# Patient Record
Sex: Female | Born: 1985 | Race: White | Hispanic: No | Marital: Single | Smoking: Never smoker
Health system: Southern US, Community
[De-identification: ages and names within clinical notes are randomized; demographics above are authoritative.]

## PROBLEM LIST (undated history)

## (undated) DIAGNOSIS — J45909 Unspecified asthma, uncomplicated: Secondary | ICD-10-CM

---

## 2014-06-28 ENCOUNTER — Ambulatory Visit
Admission: RE | Admit: 2014-06-28 | Discharge: 2014-06-28 | Disposition: A | Discharge status: Home / Self Care | Payer: 59 | Source: Ambulatory Visit | Attending: Family Medicine | Admitting: Family Medicine

## 2014-06-28 ENCOUNTER — Other Ambulatory Visit: Payer: Self-pay | Admitting: Family Medicine

## 2014-06-28 DIAGNOSIS — Z0000 Encounter for general adult medical examination without abnormal findings: Secondary | ICD-10-CM

## 2014-08-09 ENCOUNTER — Telehealth: Payer: Self-pay | Admitting: Gynecology

## 2014-08-09 NOTE — Telephone Encounter (Signed)
08/09/14-I received this patient ins info from [employer]. She has the [place] [facility],The used by the college. It covers the Paraguard and insertion at 100%, no copay as it is for contraception.Pt has appt to be seen with JF on 08-28-14. Not sure if he will be able to insert at that visit.wl

## 2014-08-16 ENCOUNTER — Other Ambulatory Visit: Payer: Self-pay | Admitting: Gynecology

## 2014-08-28 ENCOUNTER — Ambulatory Visit: Payer: Self-pay | Admitting: Gynecology

## 2014-09-13 ENCOUNTER — Encounter: Payer: Self-pay | Admitting: Gynecology

## 2014-09-13 ENCOUNTER — Ambulatory Visit (INDEPENDENT_AMBULATORY_CARE_PROVIDER_SITE_OTHER): Payer: BC Managed Care – PPO | Admitting: Gynecology

## 2014-09-13 VITALS — BP 104/68 | Ht 67.5 in | Wt 118.0 lb

## 2014-09-13 DIAGNOSIS — Z30430 Encounter for insertion of intrauterine contraceptive device: Secondary | ICD-10-CM

## 2014-09-13 DIAGNOSIS — Z975 Presence of (intrauterine) contraceptive device: Secondary | ICD-10-CM | POA: Insufficient documentation

## 2014-09-13 NOTE — Patient Instructions (Signed)
Intrauterine Device Insertion Most often, an intrauterine device (IUD) is inserted into the uterus to prevent pregnancy. There are 2 types of IUDs available:  Copper IUD--This type of IUD creates an environment that is not favorable to sperm survival. The mechanism of action of the copper IUD is not known for certain. It can stay in place for 10 years.  Hormone IUD--This type of IUD contains the hormone progestin (synthetic progesterone). The progestin thickens the cervical mucus and prevents sperm from entering the uterus, and it also thins the uterine lining. There is no evidence that the hormone IUD prevents implantation. One hormone IUD can stay in place for up to 5 years, and a different hormone IUD can stay in place for up to 3 years. An IUD is the most cost-effective birth control if left in place for the full duration. It may be removed at any time. LET YOUR HEALTH CARE PROVIDER KNOW ABOUT:  Any allergies you have.  All medicines you are taking, including vitamins, herbs, eye drops, creams, and over-the-counter medicines.  Previous problems you or members of your family have had with the use of anesthetics.  Any blood disorders you have.  Previous surgeries you have had.  Possibility of pregnancy.  Medical conditions you have. RISKS AND COMPLICATIONS  Generally, intrauterine device insertion is a safe procedure. However, as with any procedure, complications can occur. Possible complications include:  Accidental puncture (perforation) of the uterus.  Accidental placement of the IUD either in the muscle layer of the uterus (myometrium) or outside the uterus. If this happens, the IUD can be found essentially floating around the bowels and must be taken out surgically.  The IUD may fall out of the uterus (expulsion). This is more common in women who have recently had a child.   Pregnancy in the fallopian tube (ectopic).  Pelvic inflammatory disease (PID), which is infection of  the uterus and fallopian tubes. The risk of PID is slightly increased in the first 20 days after the IUD is placed, but the overall risk is still very low. BEFORE THE PROCEDURE  Schedule the IUD insertion for when you will have your menstrual period or right after, to make sure you are not pregnant. Placement of the IUD is better tolerated shortly after a menstrual cycle.  You may need to take tests or be examined to make sure you are not pregnant.  You may be required to take a pregnancy test.  You may be required to get checked for sexually transmitted infections (STIs) prior to placement. Placing an IUD in someone who has an infection can make the infection worse.  You may be given a pain reliever to take 1 or 2 hours before the procedure.  An exam will be performed to determine the size and position of your uterus.  Ask your health care provider about changing or stopping your regular medicines. PROCEDURE   A tool (speculum) is placed in the vagina. This allows your health care provider to see the lower part of the uterus (cervix).  The cervix is prepped with a medicine that lowers the risk of infection.  You may be given a medicine to numb each side of the cervix (intracervical or paracervical block). This is used to block and control any discomfort with insertion.  A tool (uterine sound) is inserted into the uterus to determine the length of the uterine cavity and the direction the uterus may be tilted.  A slim instrument (IUD inserter) is inserted through the cervical   canal and into your uterus.  The IUD is placed in the uterine cavity and the insertion device is removed.  The nylon string that is attached to the IUD and used for eventual IUD removal is trimmed. It is trimmed so that it lays high in the vagina, just outside the cervix. AFTER THE PROCEDURE  You may have bleeding after the procedure. This is normal. It varies from light spotting for a few days to menstrual-like  bleeding.  You may have mild cramping. Document Released: 07/28/2011 Document Revised: 09/19/2013 Document Reviewed: 05/20/2013 ExitCare Patient Information 2015 [employer]. This information is not intended to replace advice given to you by your health care provider. Make sure you discuss any questions you have with your health care provider.  

## 2014-09-13 NOTE — Progress Notes (Signed)
   Patient is a 28 year old who was referred to our practice [facility] for placement of a ParaGard T380A IUD. Patient had been using condoms for contraception and wanted a nonhormonal contraceptive device. The risks benefits and pros and cons were discussed. Literature information was provided. The patient had a [facility]necological exam [facility] the [facility] 1[facility]2 months ago and was normal with normal Pap smear. Patient reports normal menstrual cycles lasting 5-7 days. Patient not interested in a flu vaccine. Patient on tail end  of her menses  Exam: Bartholin urethra Skene was within normal limits Vagina: No lesions or discharge Uterus anteverted normal size shape and consistency Adnexa: No palpable mass or tenderness Rectal exam not done                                                                    IUD procedure note       Patient presented to the office today for placement of ParaGard T3 80 IUD. The patient had previously been provided with literature information on this method of contraception. The risks benefits and pros and cons were discussed and all her questions were answered. She is fully aware that this form of contraception is 99% effective and is good for 10 years.  Pelvic exam: Bartholin urethra Skene glands: Within normal limits Vagina: No lesions or discharge Cervix: No lesions or discharge Uterus: Anteverted position Adnexa: No masses or tenderness Rectal exam: Not done  The cervix was cleansed with Betadine solution. A single-tooth tenaculum was placed on the anterior cervical lip. The uterus sounded to 7 centimeter. The IUD was shown to the patient and inserted in a sterile fashion. The IUD string was trimmed. The single-tooth tenaculum was removed. Patient was instructed to return back to the office in one month for follow up.    Lot #[id number]

## 2014-09-17 ENCOUNTER — Encounter: Payer: Self-pay | Admitting: Gynecology

## 2014-10-14 ENCOUNTER — Encounter: Payer: Self-pay | Admitting: Gynecology

## 2014-10-14 ENCOUNTER — Ambulatory Visit (INDEPENDENT_AMBULATORY_CARE_PROVIDER_SITE_OTHER): Payer: BC Managed Care – PPO | Admitting: Gynecology

## 2014-10-14 VITALS — BP 104/68

## 2014-10-14 DIAGNOSIS — Z30431 Encounter for routine checking of intrauterine contraceptive device: Secondary | ICD-10-CM

## 2014-10-14 MED ORDER — FLUCONAZOLE 150 MG PO TABS: ORAL_TABLET | ORAL | Status: DC

## 2014-10-14 MED ORDER — ERYTHROMYCIN BASE 500 MG PO TABS: 500.0000 mg | ORAL_TABLET | Freq: Four times a day (QID) | ORAL | Status: DC

## 2014-10-14 NOTE — Progress Notes (Signed)
   Patient is a 28 year old who presented to the office today for 1 month follow-up after having had the ParaGard T380A IUD placed in 09/13/2014. Patient is without any complaints today.  Exam: Bartholin urethra Skene was within normal limits Vagina: No lesions or discharge Cervix: IUD string visualized Exam: Uterus anteverted normal size shape and consistency Adnexa: No palpable masses or tenderness Rectal exam: Not done  Patient refuses flu vaccine  Patient going to be backpacking required tomorrow in the month of January. Come going to call in a prescription for Diflucan for her to have available in case she may needed as well as one week's worth of erythromycin to have available when necessary (patient allergic to penicillin). Patient scheduled return next year for annual exam or when necessary.

## 2014-10-14 NOTE — Patient Instructions (Signed)

## 2015-06-08 IMAGING — CR DG CHEST 2V
2 series · 2 of 2 positions shown · non-contrast
Comparison: None.

CLINICAL DATA: Chest pain, cough

EXAM:
CHEST  2 VIEW

[w chest pa]
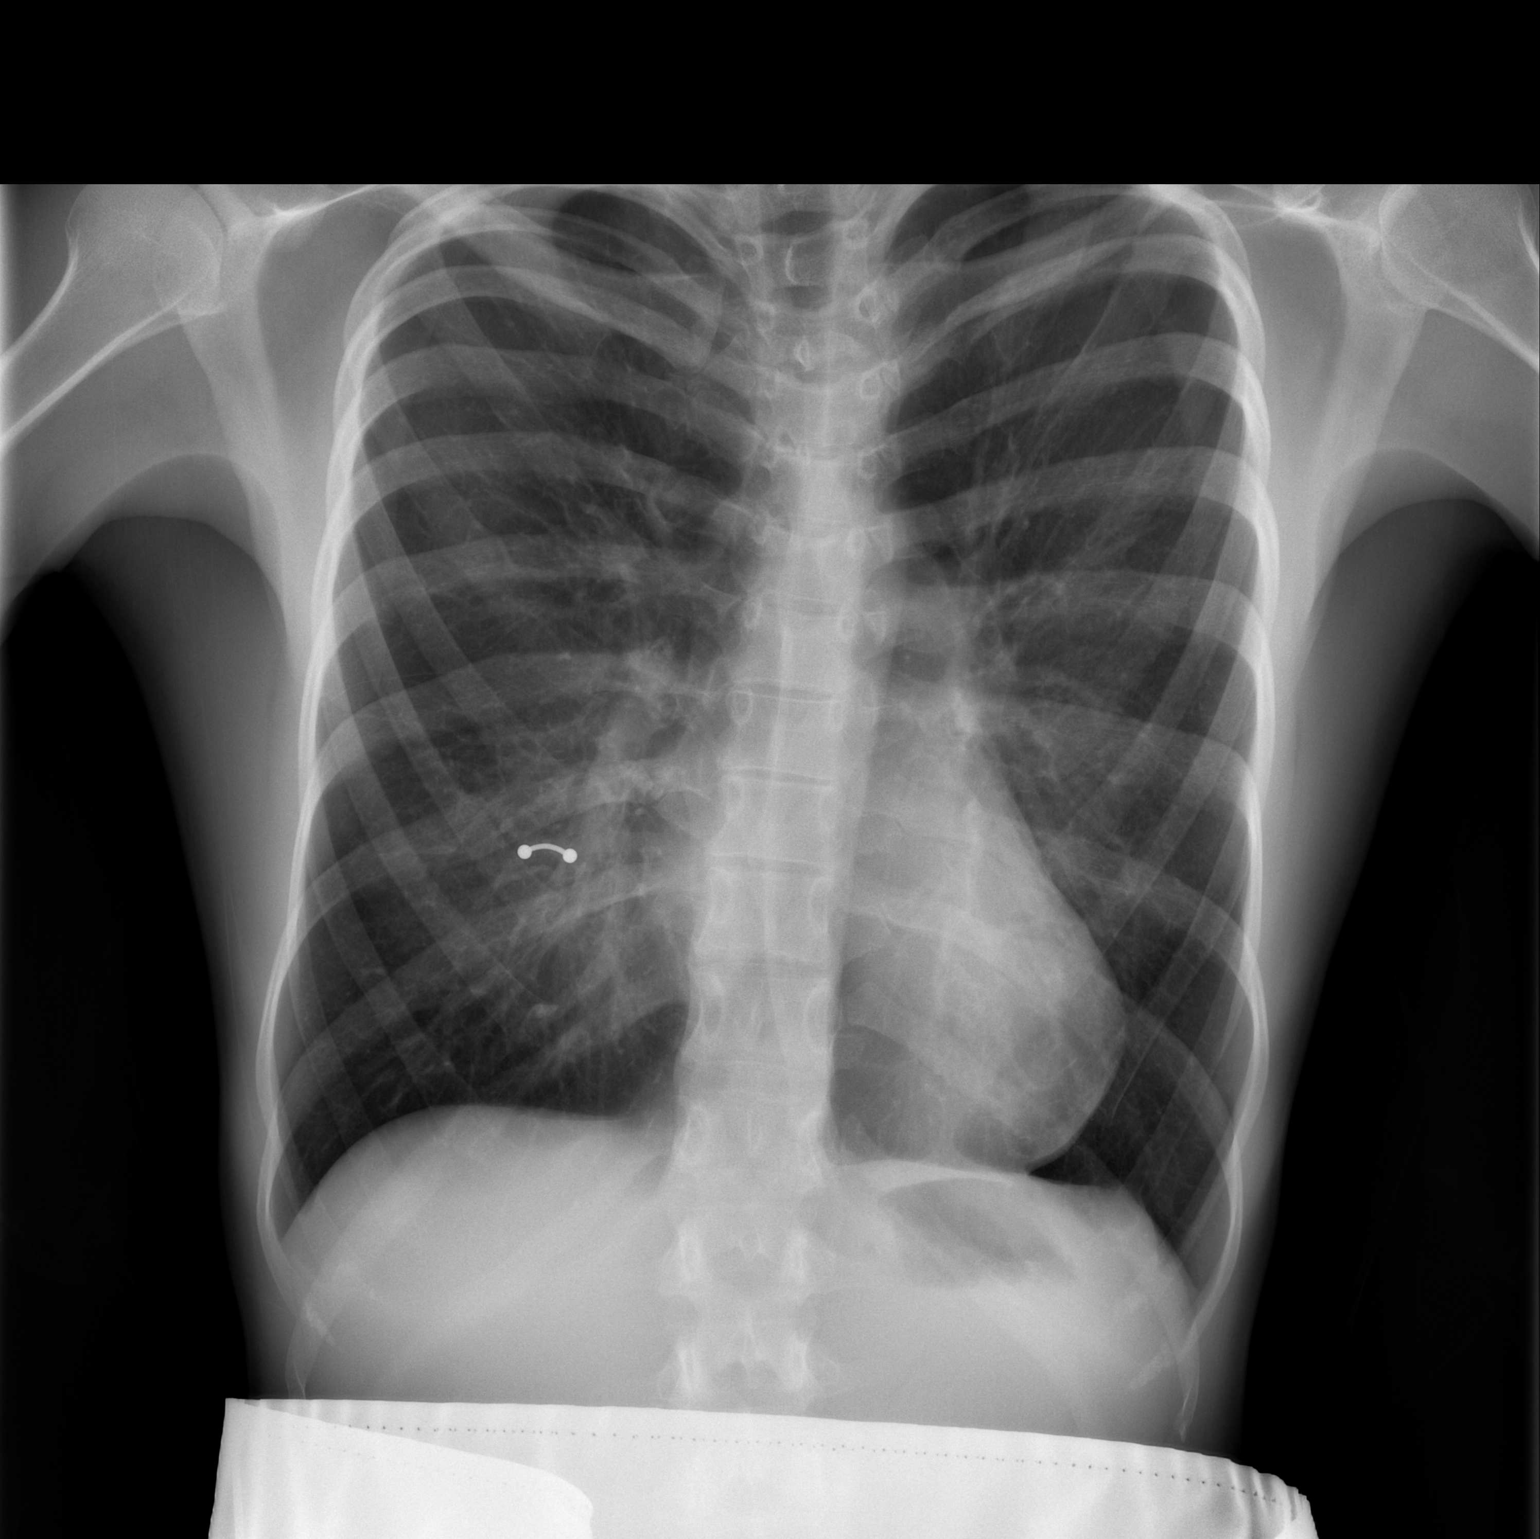

[w chest lat]
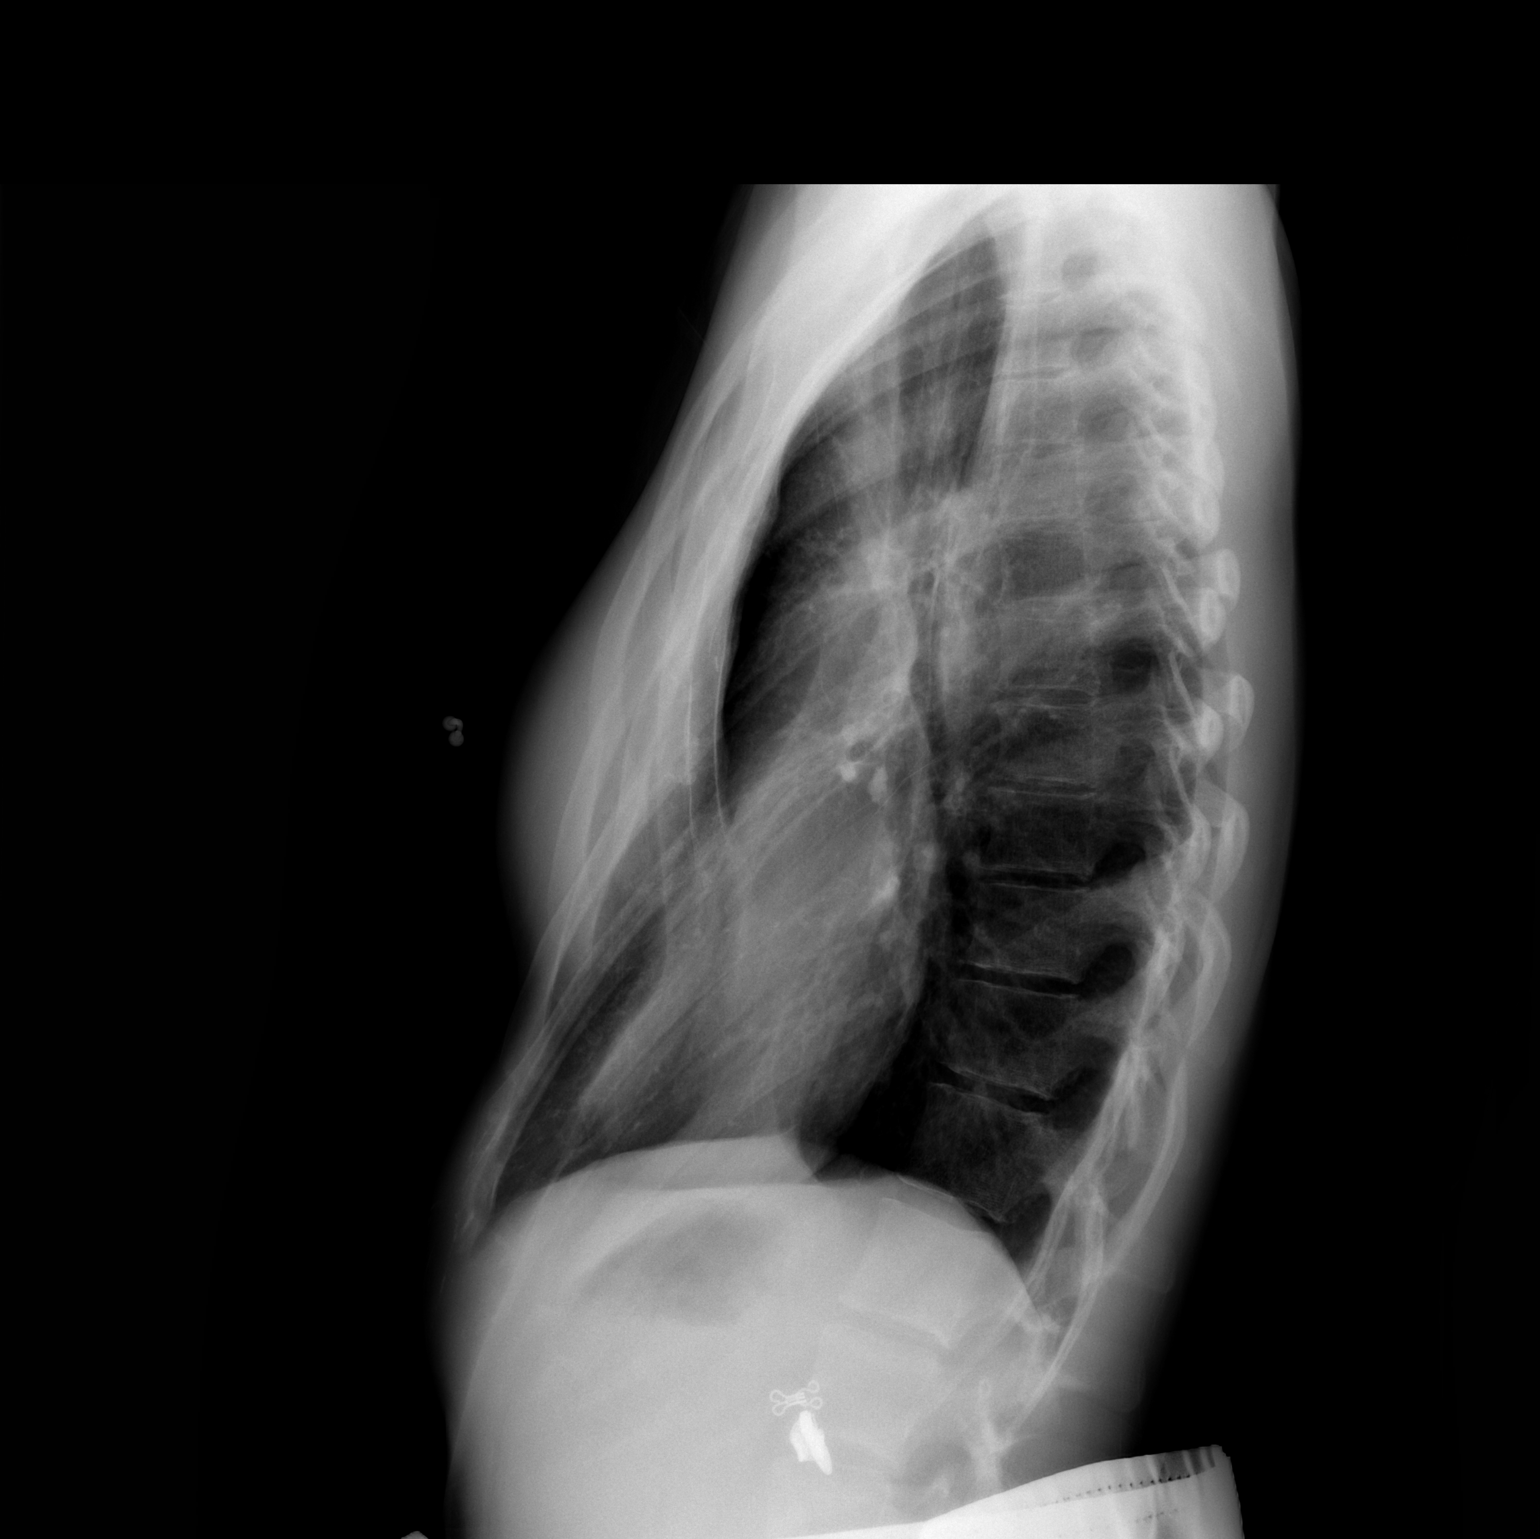

[2 of 2 positions shown; findings below may reference images not displayed]

FINDINGS: The heart size and mediastinal contours are within normal limits.
Both lungs are clear. The visualized skeletal structures are
unremarkable. Pectus excavatum noted.
IMPRESSION: No active cardiopulmonary disease.

## 2015-08-18 ENCOUNTER — Emergency Department (HOSPITAL_COMMUNITY)
Admission: EM | Admit: 2015-08-18 | Discharge: 2015-08-19 | Disposition: A | Discharge status: Other Acute Inpatient Hospital | Payer: Managed Care, Other (non HMO) | Attending: Emergency Medicine | Admitting: Emergency Medicine

## 2015-08-18 ENCOUNTER — Encounter (HOSPITAL_COMMUNITY): Payer: Self-pay | Admitting: *Deleted

## 2015-08-18 DIAGNOSIS — R4589 Other symptoms and signs involving emotional state: Secondary | ICD-10-CM

## 2015-08-18 DIAGNOSIS — F419 Anxiety disorder, unspecified: Secondary | ICD-10-CM | POA: Insufficient documentation

## 2015-08-18 DIAGNOSIS — Z79899 Other long term (current) drug therapy: Secondary | ICD-10-CM | POA: Insufficient documentation

## 2015-08-18 DIAGNOSIS — R4689 Other symptoms and signs involving appearance and behavior: Secondary | ICD-10-CM

## 2015-08-18 DIAGNOSIS — Z880 Allergy status to penicillin: Secondary | ICD-10-CM | POA: Insufficient documentation

## 2015-08-18 LAB — COMPREHENSIVE METABOLIC PANEL
ALT: 15 U/L (ref 14–54)
AST: 19 U/L (ref 15–41)
Albumin: 4.7 g/dL (ref 3.5–5.0)
Alkaline Phosphatase: 47 U/L (ref 38–126)
Anion gap: 7 (ref 5–15)
BILIRUBIN TOTAL: 0.5 mg/dL (ref 0.3–1.2)
BUN: 11 mg/dL (ref 6–20)
CALCIUM: 9.4 mg/dL (ref 8.9–10.3)
CHLORIDE: 108 mmol/L (ref 101–111)
CO2: 26 mmol/L (ref 22–32)
CREATININE: 0.89 mg/dL (ref 0.44–1.00)
Glucose, Bld: 95 mg/dL (ref 65–99)
Potassium: 3.8 mmol/L (ref 3.5–5.1)
Sodium: 141 mmol/L (ref 135–145)
TOTAL PROTEIN: 7.6 g/dL (ref 6.5–8.1)

## 2015-08-18 LAB — CBC
HCT: 40.4 % (ref 36.0–46.0)
Hemoglobin: 13.3 g/dL (ref 12.0–15.0)
MCH: 29.6 pg (ref 26.0–34.0)
MCHC: 32.9 g/dL (ref 30.0–36.0)
MCV: 90 fL (ref 78.0–100.0)
PLATELETS: 298 10*3/uL (ref 150–400)
RBC: 4.49 MIL/uL (ref 3.87–5.11)
RDW: 13.1 % (ref 11.5–15.5)
WBC: 9.3 10*3/uL (ref 4.0–10.5)

## 2015-08-18 LAB — ETHANOL

## 2015-08-18 LAB — ACETAMINOPHEN LEVEL: Acetaminophen (Tylenol), Serum: <10 ug/mL — ABNORMAL LOW (ref 10–30)

## 2015-08-18 LAB — RAPID URINE DRUG SCREEN, HOSP PERFORMED
Amphetamines: NOT DETECTED
Barbiturates: NOT DETECTED
Benzodiazepines: NOT DETECTED
Cocaine: NOT DETECTED
OPIATES: NOT DETECTED
Tetrahydrocannabinol: NOT DETECTED

## 2015-08-18 LAB — SALICYLATE LEVEL

## 2015-08-18 MED ORDER — IBUPROFEN 200 MG PO TABS: 600.0000 mg | ORAL_TABLET | Freq: Three times a day (TID) | ORAL | Status: DC | PRN

## 2015-08-18 MED ORDER — ONDANSETRON HCL 4 MG PO TABS: 4.0000 mg | ORAL_TABLET | Freq: Three times a day (TID) | ORAL | Status: DC | PRN

## 2015-08-18 MED ORDER — NICOTINE 21 MG/24HR TD PT24: 21.0000 mg | MEDICATED_PATCH | Freq: Every day | TRANSDERMAL | Status: DC

## 2015-08-18 MED ORDER — ACETAMINOPHEN 325 MG PO TABS: 650.0000 mg | ORAL_TABLET | ORAL | Status: DC | PRN

## 2015-08-18 NOTE — BHH Counselor (Signed)
Disposition: Per Donell Sievert, PA, pt meets inpt criteria. Per Bunnie Pion, pt accepted to [facility] 404-1 under the care of Dr Jama Flavors. Charge nurse will call when bed is available.  Earley Favor, NP, was made aware of disposition.   Cyndie Mull, [facility]
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
## 2015-08-18 NOTE — ED Notes (Signed)
Pt presents with complaint of depression, SI with plan to jump off bridge today, pt reports [employer] found  Her while she was talking with SI hotline on the phone.  Denies HI or AV hallucinations, reports feeling hopeless earlier today.  Denies drug or alcohol use. Pt AAO x 3, flat affect, no distress noted, calm & cooperative,  Monitoring for safety, Q 15 min checks in effect.
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
## 2015-08-18 NOTE — ED Notes (Signed)
Per boyfriend at bedside, states he has had to hide guns in the house because  patient has been contemplating on how she would take her life for 2 weeks i.e. thoughts of having a car wreck.
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
## 2015-08-19 ENCOUNTER — Encounter (HOSPITAL_COMMUNITY): Payer: Self-pay | Admitting: *Deleted

## 2015-08-19 ENCOUNTER — Inpatient Hospital Stay (HOSPITAL_COMMUNITY)
Admission: EM | Admit: 2015-08-19 | Discharge: 2015-08-21 | DRG: 885 | Disposition: A | Discharge status: Home / Self Care | Payer: Federal, State, Local not specified - Other | Source: Intra-hospital | Attending: Psychiatry | Admitting: Psychiatry

## 2015-08-19 DIAGNOSIS — F4323 Adjustment disorder with mixed anxiety and depressed mood: Secondary | ICD-10-CM | POA: Diagnosis present

## 2015-08-19 DIAGNOSIS — G4700 Insomnia, unspecified: Secondary | ICD-10-CM | POA: Diagnosis present

## 2015-08-19 DIAGNOSIS — Z818 Family history of other mental and behavioral disorders: Secondary | ICD-10-CM

## 2015-08-19 DIAGNOSIS — F332 Major depressive disorder, recurrent severe without psychotic features: Secondary | ICD-10-CM | POA: Diagnosis present

## 2015-08-19 HISTORY — DX: Unspecified asthma, uncomplicated: J45.909

## 2015-08-19 LAB — PREGNANCY, URINE: Preg Test, Ur: NEGATIVE

## 2015-08-19 MED ORDER — TRAZODONE HCL 50 MG PO TABS
50.0000 mg | ORAL_TABLET | Freq: Every evening | ORAL | Status: DC | PRN
  Filled 2015-08-19 (×6): qty 1

## 2015-08-19 MED ORDER — ALBUTEROL SULFATE HFA 108 (90 BASE) MCG/ACT IN AERS: 2.0000 | INHALATION_SPRAY | Freq: Four times a day (QID) | RESPIRATORY_TRACT | Status: DC | PRN

## 2015-08-19 MED ORDER — ENSURE ENLIVE PO LIQD: 237.0000 mL | Freq: Two times a day (BID) | ORAL | Status: DC

## 2015-08-19 MED ORDER — ALUM & MAG HYDROXIDE-SIMETH 200-200-20 MG/5ML PO SUSP: 30.0000 mL | ORAL | Status: DC | PRN

## 2015-08-19 MED ORDER — ACETAMINOPHEN 325 MG PO TABS
650.0000 mg | ORAL_TABLET | Freq: Four times a day (QID) | ORAL | Status: DC | PRN
Start: 2015-08-19 — End: 2015-08-21

## 2015-08-19 MED ORDER — HYDROXYZINE HCL 25 MG PO TABS: 25.0000 mg | ORAL_TABLET | Freq: Four times a day (QID) | ORAL | Status: DC | PRN

## 2015-08-19 MED ORDER — MAGNESIUM HYDROXIDE 400 MG/5ML PO SUSP
30.0000 mL | Freq: Every day | ORAL | Status: DC | PRN
Start: 2015-08-19 — End: 2015-08-21

## 2015-08-19 NOTE — ED Notes (Signed)
[facility] transport requested. 

## 2015-08-19 NOTE — Progress Notes (Signed)
Patient ID: Shelly Stephens, female   DOB: Apr 18, 1986, 29 y.o.   MRN: [id number] D-New admission from early this am. She is pleasant, brightens on approach, verbal and appropriate. States she got in a dark place yesterday, wanting to be alone and thinks someone saw here on the overpass standing there and called the police, and from there she got here. She has signed a 72 hour form on admission. She doesn't think she needs to be here.She has a supportive boyfriend she lives with and her best friend is in town for a visit and will visit here with her tonight. A-Support ordered. Medications as ordered. Monitored for safety and she has no complaints. She states she gets along well with her roommate that is also new and approximately her age. She recently started going to therapy in the past two weeks, and is not interested in medications. 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 Attending and participating in groups.
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
## 2015-08-19 NOTE — [facility] Group Notes (Signed)
Adult Psychoeducational Group Note  Date:  08/19/2015 Time:  10:25 PM  Group Topic/Focus:  Wrap-Up Group:   The focus of this group is to help patients review their daily goal of treatment and discuss progress on daily workbooks.  Participation Level:  Minimal  Participation Quality:  Attentive  Affect:  Appropriate  Cognitive:  Appropriate  Insight: Good  Engagement in Group:  Limited  Modes of Intervention:  Discussion  Additional Comments:  This was the patients first day.  Patient stated she was able to slow down and gain some perspective as to why she is here.  Her goal was to have less negative thoughts about her self.  Patient expressed she came up with five positive things about herself and wrote in her journal a lot today.  [name] A 08/19/2015, 10:25 PM
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
## 2015-08-19 NOTE — ED Notes (Signed)
Report called to RN Boyd Kerbs, Pending [facility] transport.

## 2015-08-19 NOTE — [facility] Group Notes (Signed)
[facility] LCSW Group Therapy 08/19/2015 1:15 PM  Type of Therapy: Group Therapy- Feelings about Diagnosis  Participation Level: Active   Participation Quality:  Appropriate  Affect:  Appropriate  Cognitive: Alert and Oriented   Insight:  Developing   Engagement in Therapy: Developing/Improving and Engaged   Modes of Intervention: Clarification, Confrontation, Discussion, Education, Exploration, Limit-setting, Orientation, Problem-solving, Rapport Building, Dance movement psychotherapist, Socialization and Support  Description of Group:   This group will allow patients to explore their thoughts and feelings about diagnoses they have received. Patients will be guided to explore their level of understanding and acceptance of these diagnoses. Facilitator will encourage patients to process their thoughts and feelings about the reactions of others to their diagnosis, and will guide patients in identifying ways to discuss their diagnosis with significant others in their lives. This group will be process-oriented, with patients participating in exploration of their own experiences as well as giving and receiving support and challenge from other group members.  Summary of Progress/Problems:  Pt was reserved in group but participated appropriately when prompted. Pt shared that she likes to "have it all together" and often times this keeps her from seeking treatment early. She expressed that she desires for her boyfriend to desire to be with her regardless of his need to feel that he needs to protect her because of her mental health status.   Therapeutic Modalities:   Cognitive Behavioral Therapy Solution Focused Therapy Motivational Interviewing Relapse Prevention Therapy  Chad Cordial, LCSWA 08/19/2015 4:14 PM
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
## 2015-08-19 NOTE — [facility] Suicide Risk Assessment (Signed)
[facility] INPATIENT:  Family/Significant Other Suicide Prevention Education  Suicide Prevention Education:  Education Completed; Onnie Boer, Pt's boyfriend, [phone],  has been identified by the patient as the family member/significant other with whom the patient will be residing, and identified as the person(s) who will aid the patient in the event of a mental health crisis (suicidal ideations/suicide attempt).  With written consent from the patient, the family member/significant other has been provided the following suicide prevention education, prior to the and/or following the discharge of the patient.  The suicide prevention education provided includes the following:  Suicide risk factors  Suicide prevention and interventions  National Suicide Hotline telephone number  [facility] assessment telephone number  [facility] Emergency Assistance 911  [facility] and/or Residential Mobile Crisis Unit telephone number  Request made of family/significant other to:  Remove weapons (e.g., guns, rifles, knives), all items previously/currently identified as safety concern.    Remove drugs/medications (over-the-counter, prescriptions, illicit drugs), all items previously/currently identified as a safety concern.  The family member/significant other verbalizes understanding of the suicide prevention education information provided.  The family member/significant other agrees to remove the items of safety concern listed above.  Elaina Hoops 08/19/2015, 1:23 PM

## 2015-08-19 NOTE — ED Notes (Signed)
[facility] at bedside for transport.  

## 2015-08-19 NOTE — Tx Team (Signed)
Interdisciplinary Treatment Plan Update (Adult) Date: 08/19/2015   Date: 08/19/2015 9:24 AM  Progress in Treatment:  Attending groups: Pt is new to milieu, continuing to assess  Participating in groups: Pt is new to milieu, continuing to assess  Taking medication as prescribed: Yes  Tolerating medication: Yes  Family/Significant othe contact made: No, CSW assessing for appropriate contacts Patient understands diagnosis: Yes AEB seeking help with depression Discussing patient identified problems/goals with staff: Yes  Medical problems stabilized or resolved: Yes  Denies suicidal/homicidal ideation: Yes Patient has not harmed self or Others: Yes   New problem(s) identified: None identified at this time.   Discharge Plan or Barriers: CSW will assess for appropriate discharge plan and relevant barriers.   Additional comments: n/a   Reason for Continuation of Hospitalization:  Depression Medication stabilization Suicidal ideation  Estimated length of stay: 2-3 days  Review of initial/current patient goals per problem list:   1.  Goal(s): Patient will participate in aftercare plan  Met:  No  Target date: 3-5 days from date of admission   As evidenced by: Patient will participate within aftercare plan AEB aftercare provider and housing plan at discharge being identified.  08/19/15: CSW to work with Pt to assess for appropriate discharge plan and faciliate appointments and referrals as needed prior to d/c.  2.  Goal (s): Patient will exhibit decreased depressive symptoms and suicidal ideations.  Met:  No  Target date: 3-5 days from date of admission   As evidenced by: Patient will utilize self rating of depression at 3 or below and demonstrate decreased signs of depression or be deemed stable for discharge by MD. 08/19/15: Pt was admitted with symptoms of depression, rating 10/10. Pt continues to present with flat affect and depressive symptoms.  Pt will demonstrate decreased symptoms  of depression and rate depression at 3/10 or lower prior to discharge.  Attendees:  Patient:    Family:    Physician: Dr. Parke Poisson, MD  08/19/2015 9:24 AM  Nursing: Lars Pinks, RN Case manager  08/19/2015 9:24 AM  Clinical Social Worker Norman Clay, MSW 08/19/2015 9:24 AM  Other: Jake Bathe Liasion 08/19/2015 9:24 AM  Clinical:  Allene Dillon, RN 08/19/2015 9:24 AM  Other: , RN Charge Nurse 08/19/2015 9:24 AM  Other:     Peri Maris, Latanya Presser MSW

## 2015-08-19 NOTE — Progress Notes (Signed)
Recreation Therapy Notes  Animal-Assisted Activity (AAA) Program Checklist/Progress Notes Patient Eligibility Criteria Checklist & Daily Group note for Rec Tx Intervention  Date: 09.06.2016 Time: 2:45pm Location: [address] Dayroom    AAA/T Program Assumption of Risk Form signed by Patient/ or Parent Legal Guardian yes  Patient is free of allergies or sever asthma yes  Patient reports no fear of animals yes  Patient reports no history of cruelty to animals yes  Patient understands his/her participation is voluntary yes  Patient washes hands before animal contact yes  Patient washes hands after animal contact yes  Behavioral Response: Appropriate   Education: Hand Washing, Appropriate Animal Interaction   Education Outcome: Acknowledges education.   Clinical Observations/Feedback: Patient engaged appropriately with therapy dog, handler and peers during session.   Shelly Stephens L Kaleah Hagemeister, LRT/CTRS  Shelly Stephens L 08/19/2015 3:12 PM 

## 2015-08-20 NOTE — Plan of Care (Signed)
Problem: Diagnosis: Increased Risk For Suicide Attempt Goal: LTG-Patient Will Report Improved Mood and Deny Suicidal LTG (by discharge) Patient will report improved mood and deny suicidal ideation.  Outcome: Progressing Pt reports that she is feeling better since she was admitted to the unit and realizes that she has people in her life to help her and support her.  She appears more bright in mood and denies SI.

## 2015-08-20 NOTE — Progress Notes (Signed)
Recreation Therapy Notes  Date: 09.07.2016 Time: 9:30am Location: [address] Group Room   Group Topic: Stress Management  Goal Area(s) Addresses:  Patient will actively participate in stress management techniques presented during session.   Behavioral Response: Appropriate    Intervention: Stress management techniques  Activity :  Deep Breathing and Progressive Muscle Relaxation. LRT provided instruction and demonstration on practice of Progressive Muscle Relaxation. Technique was coupled with deep breathing.   Education:  Stress Management, Discharge Planning.   Education Outcome: Acknowledges education  Clinical Observations/Feedback: Patient participated appropriately in techniques introduced during session, expressed no concerns and demonstrated ability to practice independently post d/c.    Alyia Lacerte L Taia Bramlett, LRT/CTRS        Shonique Pelphrey L 08/20/2015 2:55 PM 
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
## 2015-08-20 NOTE — [facility] Group Notes (Signed)
[facility] LCSW Group Therapy 08/20/2015 1:15 PM  Type of Therapy: Group Therapy- Emotion Regulation  Participation Level: Active   Participation Quality:  Appropriate  Affect: Appropriate  Cognitive: Alert and Oriented   Insight:  Developing/Improving  Engagement in Therapy: Developing/Improving and Engaged   Modes of Intervention: Clarification, Confrontation, Discussion, Education, Exploration, Limit-setting, Orientation, Problem-solving, Rapport Building, Dance movement psychotherapist, Socialization and Support  Summary of Progress/Problems: The topic for group today was emotional regulation. This group focused on both positive and negative emotion identification and allowed group members to process ways to identify feelings, regulate negative emotions, and find healthy ways to manage internal/external emotions. Group members were asked to reflect on a time when their reaction to an emotion led to a negative outcome and explored how alternative responses using emotion regulation would have benefited them. Group members were also asked to discuss a time when emotion regulation was utilized when a negative emotion was experienced. Pt continues to be reserved in group but is attentive as she writes in her journal and offers affirming non-verbal gestures to group members and facilitator.  Pt did offer suggestions to other peers as she described her need to center and ground herself when feeling anxious. She discussed the importance of taking time to process conflict and anger with her significant other and how this helps prevent arguments.   Chad Cordial, LCSWA 08/20/2015 4:15 PM

## 2015-08-20 NOTE — Progress Notes (Signed)
Patient ID: Shelly Stephens, female   DOB: 1986-12-05, 29 y.o.   MRN: [id number] D-Self inventory completed and scores self a 0 on how she is feeling today which is the best. She is wanting discharge today and asking what time her partner can pick her up. It has been determined by treatment team she will go home tomorrow. A-Support offered monitored for safety and medications as ordered.  R-No complaints at this time. She is participating in groups and has positive peer interactions.

## 2015-08-20 NOTE — [facility] Group Notes (Signed)
   [facility] LCSW Aftercare Discharge Planning Group Note  08/20/2015  8:45 AM   Participation Quality: Alert, Appropriate and Oriented  Mood/Affect: Appropriate  Depression Rating: 0  Anxiety Rating: 1  Thoughts of Suicide: Pt denies SI/HI  Will you contract for safety? Yes  Current AVH: Pt denies  Plan for Discharge/Comments: Pt attended discharge planning group and actively participated in group. CSW provided pt with today's workbook. Patient reports feeling "good" today. She plans on returning home to follow up with outpatient services at discharge. She is requesting information on taking out a restraining order at discharge.   Transportation Means: Pt reports access to transportation  Supports: No supports mentioned at this time  Samuella Bruin, MSW, [employer] Clinical Social Worker [employer] [phone]

## 2015-08-21 ENCOUNTER — Encounter (HOSPITAL_COMMUNITY): Payer: Self-pay | Admitting: Registered Nurse

## 2015-08-21 MED ORDER — TRAZODONE HCL 50 MG PO TABS
50.0000 mg | ORAL_TABLET | Freq: Every evening | ORAL | Status: DC | PRN
  Filled 2015-08-21: qty 7

## 2015-08-21 MED ORDER — ALBUTEROL SULFATE HFA 108 (90 BASE) MCG/ACT IN AERS: 2.0000 | INHALATION_SPRAY | Freq: Four times a day (QID) | RESPIRATORY_TRACT | Status: AC | PRN

## 2015-08-21 MED ORDER — TRAZODONE HCL 50 MG PO TABS
50.0000 mg | ORAL_TABLET | Freq: Every evening | ORAL | Status: AC | PRN
Start: 2015-08-21 — End: ?

## 2015-08-21 NOTE — [facility] Group Notes (Signed)
[facility] Group Notes:  (Nursing/MHT/Case Management/Adjunct)  Date:  08/21/2015  Time:  0900  Type of Therapy:  Nurse Education  Participation Level:  Active  Participation Quality:  Appropriate  Affect:  Appropriate  Cognitive:  Appropriate  Insight:  Appropriate  Engagement in Group:  Engaged  Modes of Intervention:  Discussion, Education and Support  Summary of Progress/Problems: Chealsea shared that she is discharging today. She hopes to work on finding a couples therapist.   Maurine Simmering 08/21/2015, 9:27 AM
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
## 2015-08-21 NOTE — Progress Notes (Signed)
  [facility] Adult Case Management Discharge Plan :  Will you be returning to the same living situation after discharge:  Yes,  return home At discharge, do you have transportation home?: Yes,  boyfriend/family Do you have the ability to pay for your medications: Yes,  mental health  Release of information consent forms completed and in the chart;  Patient's signature needed at discharge.  Patient to Follow up at: Follow-up Information    Follow up with [facility] On 08/22/2015.   Why:  Appt on this date at 9:30AM for counseling.    Contact information:   ATTN: Lyland Wingfield [address], [place] [address] Phone: [phone] Fax: [phone]    Pt is not taking mental health medication---no psychiatry appt needed.   Patient denies SI/HI: Yes,  during group/self report.    Safety Planning and Suicide Prevention discussed: Yes,  SPE completed with pt's boyfriend. Pt provided with SPI pamphlet and encouraged to share this information with her support network.   Have you used any form of tobacco in the last 30 days? (Cigarettes, Smokeless Tobacco, Cigars, and/or Pipes): No  Has patient been referred to the Quitline?: N/A patient is not a smoker  Smart, [place] LCSWA 08/21/2015, 9:55 AM

## 2015-08-21 NOTE — Tx Team (Signed)
Interdisciplinary Treatment Plan Update (Adult) Date: 08/21/2015   Date: 08/21/2015 9:57 AM  Progress in Treatment:  Attending groups: Yes  Participating in groups: Yes  Taking medication as prescribed: Yes  Tolerating medication: Yes  Family/Significant othe contact made: SPE completed with pt's boyfriend.  Patient understands diagnosis: Yes AEB seeking help with depression Discussing patient identified problems/goals with staff: Yes  Medical problems stabilized or resolved: Yes  Denies suicidal/homicidal ideation: Yes Patient has not harmed self or Others: Yes   Discharge Plan or Barriers: Follow-up scheduled with pt's counselor for tomorrow morning at 9:30AM.   Additional comments: n/a   Reason for Continuation of Hospitalization:  None  Estimated length of stay: d/c today   Review of initial/current patient goals per problem list:   1.  Goal(s): Patient will participate in aftercare plan  Met:  Yes  Target date: 3-5 days from date of admission   As evidenced by: Patient will participate within aftercare plan AEB aftercare provider and housing plan at discharge being identified.  08/19/15: CSW to work with Pt to assess for appropriate discharge plan and faciliate appointments and referrals as needed prior to d/c. 9/8: Pt to return home. Outpatient followup in place with L[facility]field-counselor.   2.  Goal (s): Patient will exhibit decreased depressive symptoms and suicidal ideations.  Met:  Yes   Target date: 3-5 days from date of admission   As evidenced by: Patient will utilize self rating of depression at 3 or below and demonstrate decreased signs of depression or be deemed stable for discharge by MD. 08/19/15: Pt was admitted with symptoms of depression, rating 10/10. Pt continues to present with flat affect and depressive symptoms.  Pt will demonstrate decreased symptoms of depression and rate depression at 3/10 or lower prior to discharge. 9/8: Pt endorsing minimal  depression and denies SI/Hi/AVH. Pt pleasant/calm and appears to be presenting at her baseline.   Attendees:  Patient:    Family:    Physician: Dr. Parke Poisson, MD  08/21/2015 9:57 AM  Nursing: Lars Pinks, RN Case manager  08/21/2015 9:57 AM  Clinical Social Worker Norman Clay, MSW 08/21/2015 9:57 AM  Other: Lucinda Dell, Beverly Sessions Liasion 08/21/2015 9:57 AM  Clinical:  Benjamine Mola RN  08/21/2015 9:57 AM  Other: , RN Charge Nurse 08/21/2015 9:57 AM  Other:     Maxie Better, LCSWA Clinical Social Worker 08/21/2015 9:59 AM
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
## 2015-08-21 NOTE — Progress Notes (Signed)
Shelly Stephens was discharged to lobby, where she awaited her ride. AVS given and discussed. Scripts given and discussed. Pt verbalized feelings of safety and readiness for discharge. She said she understood her follow-up care and instructions. Pt declined medication samples, which were returned to pharmacist. Pt signed for and received all belongings. Instructed pt regarding crisis hotline and numbers to call if needed.

## 2015-08-21 NOTE — Progress Notes (Signed)
Adult Psychoeducational Group Note  Date:  08/21/2015 Time:  1:57 AM  Group Topic/Focus:  Wrap-Up Group:   The focus of this group is to help patients review their daily goal of treatment and discuss progress on daily workbooks.  Participation Level:  Active  Participation Quality:  Appropriate  Affect:  Appropriate  Cognitive:  Appropriate  Insight: Good  Engagement in Group:  Engaged  Modes of Intervention:  Education  Additional Comments:  Pt goal today was to practice compassion for herself and stop the cycle of anxiety,pt stated she felt fantastic when she achieved her goal.Tomorrow pt wants to work on celebrating recovery in the short term discharge.   Horst Ostermiller, Sharen Counter 08/21/2015, 1:57 AM

## 2015-08-21 NOTE — Progress Notes (Signed)
D: Patient alert and oriented x 4. Patient denies pain/SI/HI/AVH. Patient refused scheduled medications at 2200. Patient states she is just doing therapy right now and doesn't want to start any medication. Patient states she is feeling much better and feels like she cane handle outside stressors better now.   A: Staff to monitor Q 15 mins for safety. Encouragement and support offered.  R: Patient remains safe on the unit. Patient attended group tonight. Patient visible on the unit and interacting with peers.

## 2015-09-10 NOTE — Clinical Social Work Note (Signed)
Patient has care coordinator w [facility] [place] [phone]).  Santa Genera, LCSW Clinical Social Worker

## 2017-04-27 ENCOUNTER — Encounter: Payer: Self-pay | Admitting: Gynecology
